# Patient Record
Sex: Male | Born: 2000 | Race: Black or African American | Hispanic: No | Marital: Single | Smoking: Never smoker
Health system: Southern US, Community
[De-identification: ages and names within clinical notes are randomized; demographics above are authoritative.]

## PROBLEM LIST (undated history)

## (undated) HISTORY — PX: TONSILLECTOMY AND ADENOIDECTOMY: SHX28

## (undated) HISTORY — PX: HERNIA REPAIR: SHX51

---
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
## 2021-04-04 ENCOUNTER — Other Ambulatory Visit: Payer: Self-pay

## 2021-04-04 ENCOUNTER — Emergency Department (HOSPITAL_COMMUNITY)
Admission: EM | Admit: 2021-04-04 | Discharge: 2021-04-04 | Disposition: A | Discharge status: Home / Self Care | Payer: Medicaid Other | Attending: Emergency Medicine | Admitting: Emergency Medicine

## 2021-04-04 DIAGNOSIS — R1013 Epigastric pain: Secondary | ICD-10-CM | POA: Diagnosis not present

## 2021-04-04 DIAGNOSIS — M549 Dorsalgia, unspecified: Secondary | ICD-10-CM | POA: Insufficient documentation

## 2021-04-04 DIAGNOSIS — R112 Nausea with vomiting, unspecified: Secondary | ICD-10-CM | POA: Insufficient documentation

## 2021-04-04 DIAGNOSIS — Z5321 Procedure and treatment not carried out due to patient leaving prior to being seen by health care provider: Secondary | ICD-10-CM | POA: Insufficient documentation

## 2021-04-04 LAB — CBC WITH DIFFERENTIAL/PLATELET
Abs Immature Granulocytes: 0.03 10*3/uL (ref 0.00–0.07)
Basophils Absolute: 0 10*3/uL (ref 0.0–0.1)
Basophils Relative: 0 %
Eosinophils Absolute: 0 10*3/uL (ref 0.0–0.5)
Eosinophils Relative: 0 %
HCT: 42.2 % (ref 39.0–52.0)
Hemoglobin: 13.5 g/dL (ref 13.0–17.0)
Immature Granulocytes: 0 %
Lymphocytes Relative: 9 %
Lymphs Abs: 0.8 10*3/uL (ref 0.7–4.0)
MCH: 29.3 pg (ref 26.0–34.0)
MCHC: 32 g/dL (ref 30.0–36.0)
MCV: 91.7 fL (ref 80.0–100.0)
Monocytes Absolute: 0.7 10*3/uL (ref 0.1–1.0)
Monocytes Relative: 8 %
Neutro Abs: 7.2 10*3/uL (ref 1.7–7.7)
Neutrophils Relative %: 83 %
Platelets: 206 10*3/uL (ref 150–400)
RBC: 4.6 MIL/uL (ref 4.22–5.81)
RDW: 11.7 % (ref 11.5–15.5)
WBC: 8.7 10*3/uL (ref 4.0–10.5)
nRBC: 0 % (ref 0.0–0.2)

## 2021-04-04 LAB — COMPREHENSIVE METABOLIC PANEL
ALT: 13 U/L (ref 0–44)
AST: 23 U/L (ref 15–41)
Albumin: 4.5 g/dL (ref 3.5–5.0)
Alkaline Phosphatase: 43 U/L (ref 38–126)
Anion gap: 11 (ref 5–15)
BUN: 10 mg/dL (ref 6–20)
CO2: 23 mmol/L (ref 22–32)
Calcium: 9.5 mg/dL (ref 8.9–10.3)
Chloride: 103 mmol/L (ref 98–111)
Creatinine, Ser: 0.94 mg/dL (ref 0.61–1.24)
GFR, Estimated: >60 mL/min (ref >60)
Glucose, Bld: 125 mg/dL — ABNORMAL HIGH (ref 70–99)
Potassium: 3.8 mmol/L (ref 3.5–5.1)
Sodium: 137 mmol/L (ref 135–145)
Total Bilirubin: 2 mg/dL — ABNORMAL HIGH (ref 0.3–1.2)
Total Protein: 7.1 g/dL (ref 6.5–8.1)

## 2021-04-04 LAB — LIPASE, BLOOD: Lipase: 30 U/L (ref 11–51)

## 2021-04-04 MED ORDER — ONDANSETRON 4 MG PO TBDP: 4.0000 mg | ORAL_TABLET | Freq: Once | ORAL | Status: DC

## 2021-04-04 NOTE — ED Triage Notes (Signed)
Pt arrives via EMS from home with c/o abdominal pain radiating to back X1 day. N/V. Denies fever.  A/OX4

## 2021-04-04 NOTE — ED Provider Notes (Signed)
Emergency Medicine Provider Triage Evaluation Note  Jorge White , a 20 y.o. male  was evaluated in triage.  Pt complains of epigastric abdominal pain with nausea and vomiting.  Pain radiates to his back, is intermittent.  Vomiting has resolved for the moment.  States similar episodes previously, does admit to marijuana use.  Denies fevers, chills.  No other complaints or concerns..  Review of Systems  Positive: Abdominal pain, nausea or vomiting Negative: Fevers, chills, changes in bowel or bladder habits  Physical Exam  BP (!) 130/56 (BP Location: Left Arm)   Pulse (!) 51   Temp 98.5 F (36.9 C)   Resp 15   SpO2 99%  Gen:   Awake, no distress   Resp:  Normal effort  MSK:   Moves extremities without difficulty  Other:  Abdomen is soft and nontender  Medical Decision Making  Medically screening exam initiated at 11:01 AM.  Appropriate orders placed.  [name] was informed that the remainder of the evaluation will be completed by another provider, this initial triage assessment does not replace that evaluation, and the importance of remaining in the ED until their evaluation is complete.     Jeannie Fend, PA-C 04/04/21 1102    Gerhard Munch, MD 04/05/21 1655

## 2022-02-09 ENCOUNTER — Ambulatory Visit (HOSPITAL_COMMUNITY): Payer: Medicaid Other

## 2022-02-10 ENCOUNTER — Ambulatory Visit (HOSPITAL_COMMUNITY): Payer: Medicaid Other

## 2022-02-11 ENCOUNTER — Ambulatory Visit (HOSPITAL_COMMUNITY): Payer: Medicaid Other

## 2022-02-14 ENCOUNTER — Ambulatory Visit (HOSPITAL_COMMUNITY): Payer: Medicaid Other

## 2022-02-14 ENCOUNTER — Ambulatory Visit (HOSPITAL_COMMUNITY): Payer: Self-pay
